# Patient Record
Sex: Female | Born: 1990 | Hispanic: Yes | Marital: Single | Smoking: Never smoker
Health system: Southern US, Community
[De-identification: ages and names within clinical notes are randomized; demographics above are authoritative.]

---
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
## 2010-10-07 ENCOUNTER — Ambulatory Visit: Payer: Self-pay | Admitting: Family

## 2010-10-30 ENCOUNTER — Observation Stay: Payer: Self-pay | Admitting: Obstetrics and Gynecology

## 2010-11-10 ENCOUNTER — Ambulatory Visit: Payer: Self-pay | Admitting: Family

## 2012-03-06 IMAGING — US US OB US >=[ID] SNGL FETUS
1 series · 13 of 28 positions shown · non-contrast
Comparison: none

REASON FOR EXAM: dates placenta location anatomy
COMMENTS:

[Series 1: us ob us >=(id) sngl fetus · 0.33mm/px · 13 of 88 slices shown]
[im 4/88]
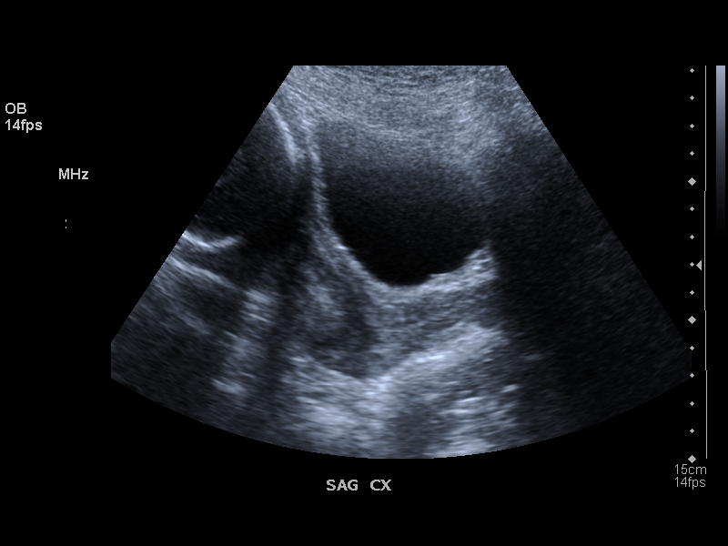
[im 10/88]
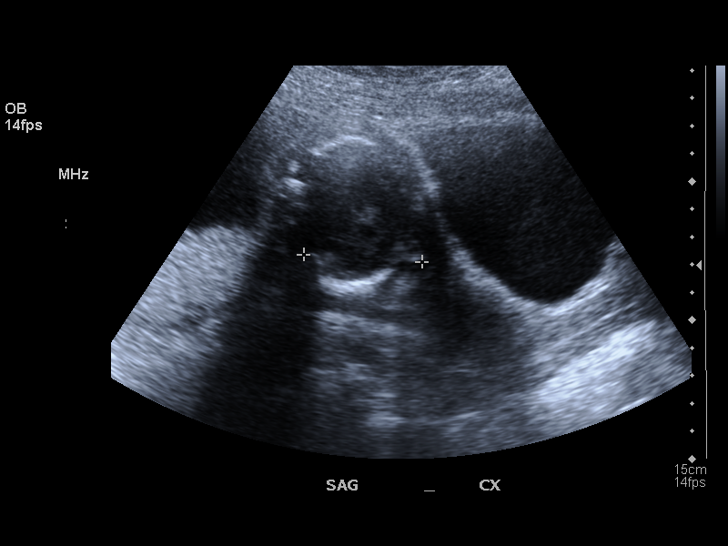
[im 17/88]
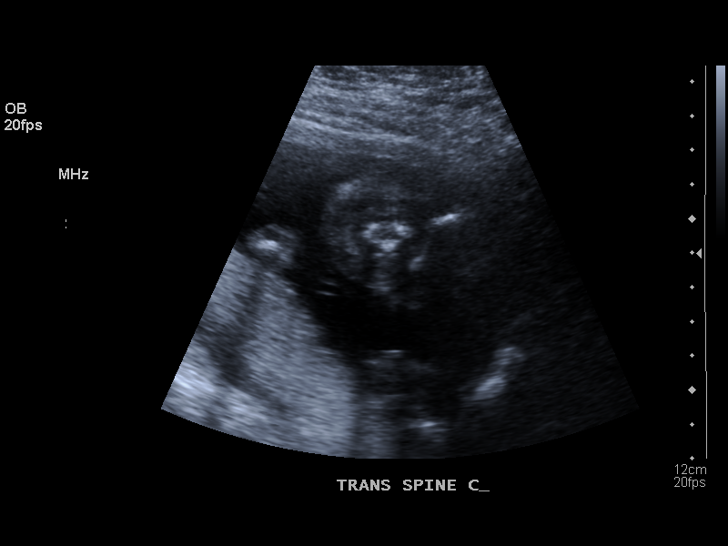
[im 23/88]
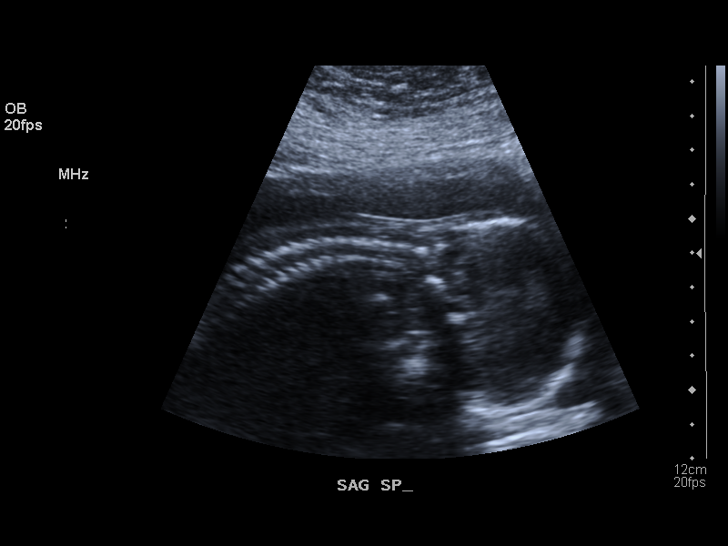
[im 30/88]
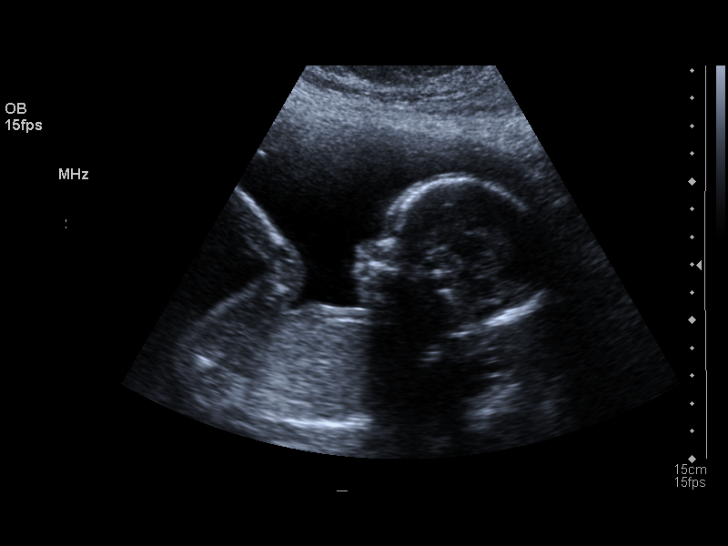
[im 36/88]
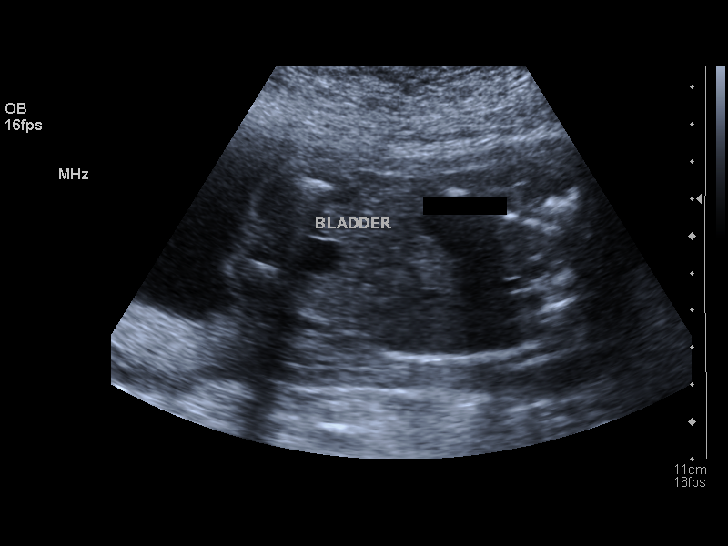
[im 46/88]
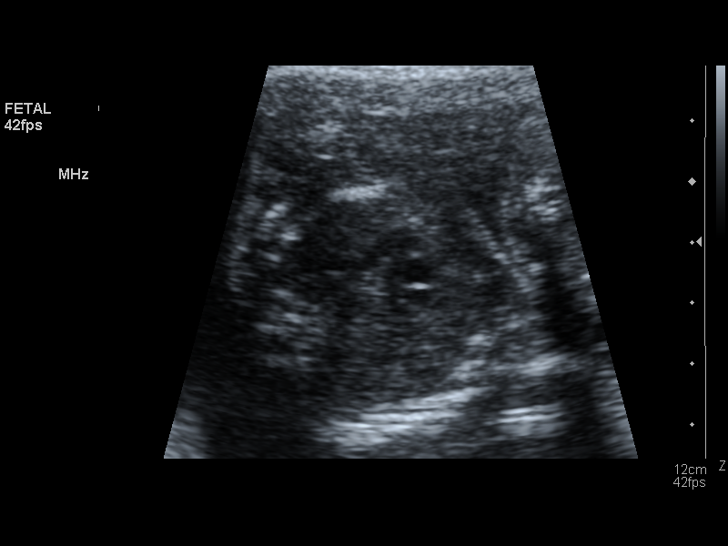
[im 52/88]
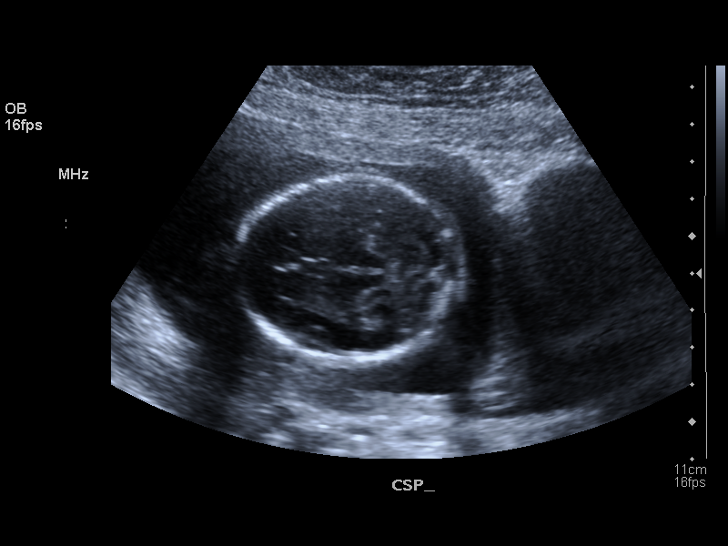
[im 59/88]
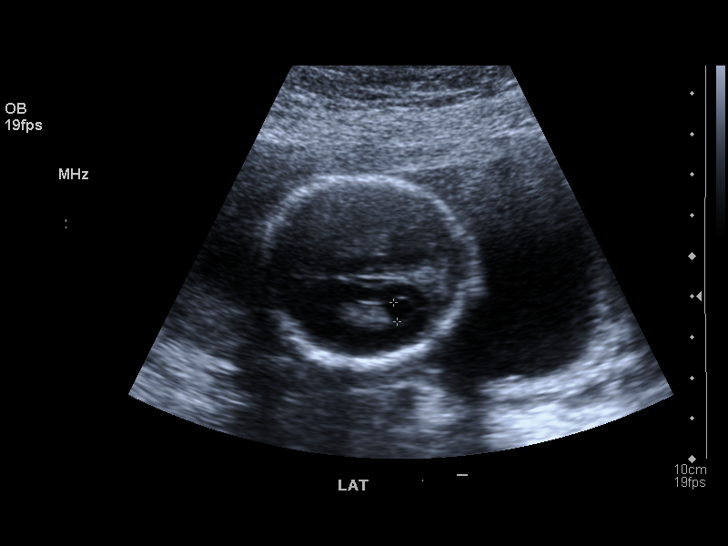
[im 65/88]
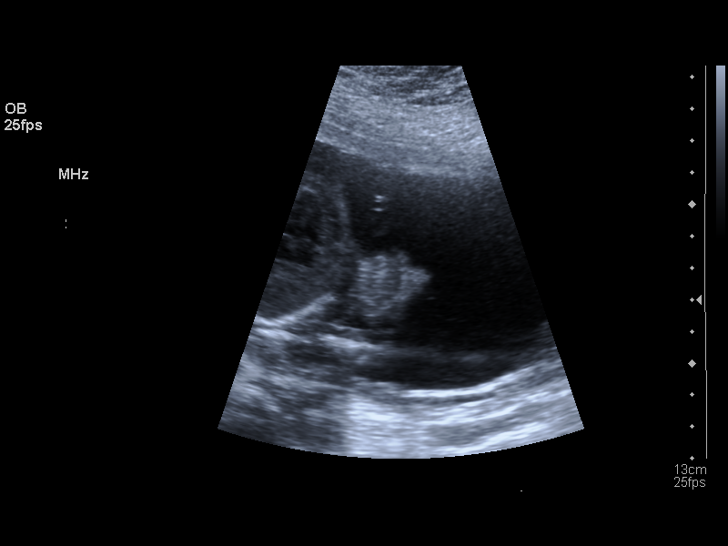
[im 71/88]
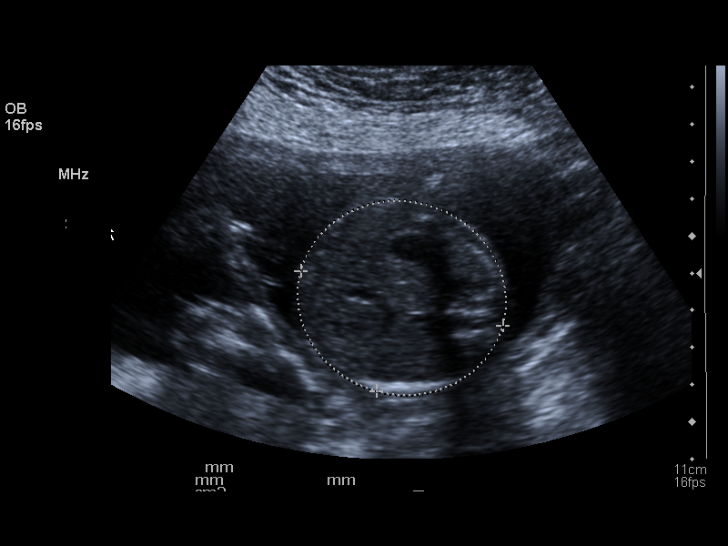
[im 78/88]
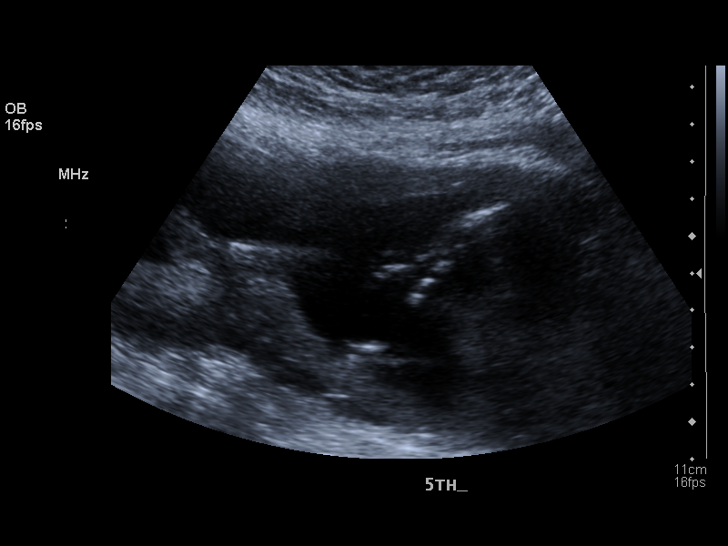
[im 84/88]
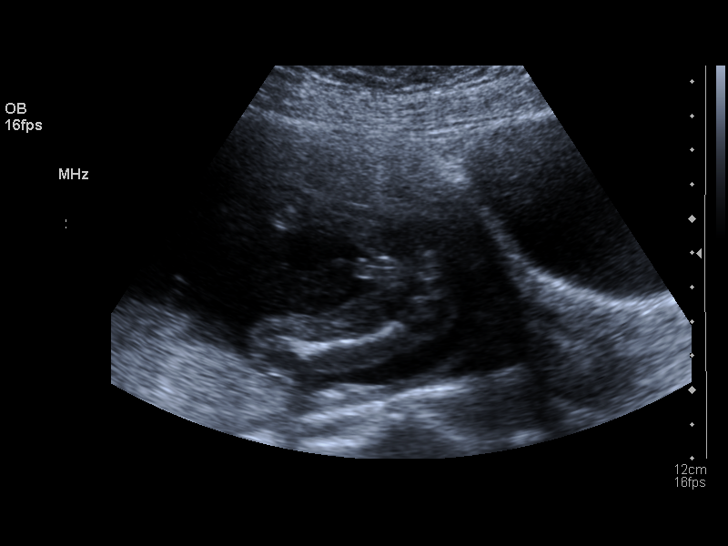

[13 of 28 positions shown; findings below may reference images not displayed]

PROCEDURE:     US  - US OB GREATER/OR EQUAL TO VVF4G  - October 07, 2010  [DATE]

RESULT:     There is a gravid uterus present. The presentation is cephalic.
The amniotic fluid volume is estimated to be normal. The placenta is
posterior and partially fundal. The lower placental segment measures 4.3 cm
from the internal cervical os.

Fetal cardiac activity of 108 bpm and was demonstrated. A 4 chambered heart
was seen. The intracranial structures, the craniocervical junction, and the
spinal structures are grossly normal. The fetal stomach, kidneys, and
urinary bladder were demonstrated.

Measured parameters:
BPD 49 mm corresponding to an EGA of 20 weeks 6 days
HC 181.2 mm corresponding to an EGA of 20 weeks 4 days
HC 168.5 mm corresponding to an EGA of 21 weeks 6 days
FL 34.6 mm corresponding to an EGA of 20 weeks 6 days
HL 34.2 mm corresponding to an EGA of 21 weeks 5 days
Estimated fetal weight is 414 grams + / - 61 grams.
IMPRESSION: There is a viable IUP with estimated gestational age of 21
weeks one day + / - 12 days. The estimated date of confinement is 16 February, 2011. No fetal anomalies were identified.

## 2016-01-27 ENCOUNTER — Emergency Department
Admission: EM | Admit: 2016-01-27 | Discharge: 2016-01-27 | Disposition: A | Discharge status: Home / Self Care | Payer: Medicaid Other | Attending: Emergency Medicine | Admitting: Emergency Medicine

## 2016-01-27 ENCOUNTER — Emergency Department: Payer: Medicaid Other

## 2016-01-27 ENCOUNTER — Encounter: Payer: Self-pay | Admitting: Emergency Medicine

## 2016-01-27 DIAGNOSIS — R1031 Right lower quadrant pain: Secondary | ICD-10-CM | POA: Diagnosis not present

## 2016-01-27 DIAGNOSIS — R102 Pelvic and perineal pain: Secondary | ICD-10-CM | POA: Diagnosis not present

## 2016-01-27 DIAGNOSIS — R1032 Left lower quadrant pain: Secondary | ICD-10-CM | POA: Insufficient documentation

## 2016-01-27 DIAGNOSIS — O2691 Pregnancy related conditions, unspecified, first trimester: Secondary | ICD-10-CM

## 2016-01-27 DIAGNOSIS — Z3A09 9 weeks gestation of pregnancy: Secondary | ICD-10-CM | POA: Insufficient documentation

## 2016-01-27 DIAGNOSIS — O26891 Other specified pregnancy related conditions, first trimester: Secondary | ICD-10-CM | POA: Insufficient documentation

## 2016-01-27 LAB — COMPREHENSIVE METABOLIC PANEL
ALBUMIN: 4.3 g/dL (ref 3.5–5.0)
ALK PHOS: 85 U/L (ref 38–126)
ALT: 74 U/L — ABNORMAL HIGH (ref 14–54)
ANION GAP: 7 (ref 5–15)
AST: 46 U/L — ABNORMAL HIGH (ref 15–41)
BILIRUBIN TOTAL: 0.7 mg/dL (ref 0.3–1.2)
BUN: 13 mg/dL (ref 6–20)
CALCIUM: 9.3 mg/dL (ref 8.9–10.3)
CO2: 22 mmol/L (ref 22–32)
Chloride: 107 mmol/L (ref 101–111)
Creatinine, Ser: 0.74 mg/dL (ref 0.44–1.00)
GFR calc Af Amer: >60 mL/min (ref >60)
GFR calc non Af Amer: >60 mL/min (ref >60)
GLUCOSE: 113 mg/dL — AB (ref 65–99)
Potassium: 3.5 mmol/L (ref 3.5–5.1)
Sodium: 136 mmol/L (ref 135–145)
TOTAL PROTEIN: 7.6 g/dL (ref 6.5–8.1)

## 2016-01-27 LAB — CBC WITH DIFFERENTIAL/PLATELET
BASOS ABS: 0 10*3/uL (ref 0–0.1)
Basophils Relative: 1 %
EOS PCT: 1 %
Eosinophils Absolute: 0.1 10*3/uL (ref 0–0.7)
HEMATOCRIT: 38.6 % (ref 35.0–47.0)
Hemoglobin: 13.6 g/dL (ref 12.0–16.0)
LYMPHS PCT: 22 %
Lymphs Abs: 2.1 10*3/uL (ref 1.0–3.6)
MCH: 30.8 pg (ref 26.0–34.0)
MCHC: 35.2 g/dL (ref 32.0–36.0)
MCV: 87.5 fL (ref 80.0–100.0)
MONO ABS: 0.6 10*3/uL (ref 0.2–0.9)
MONOS PCT: 6 %
NEUTROS ABS: 6.6 10*3/uL — AB (ref 1.4–6.5)
Neutrophils Relative %: 70 %
PLATELETS: 235 10*3/uL (ref 150–440)
RBC: 4.41 MIL/uL (ref 3.80–5.20)
RDW: 12.7 % (ref 11.5–14.5)
WBC: 9.4 10*3/uL (ref 3.6–11.0)

## 2016-01-27 LAB — URINALYSIS COMPLETE WITH MICROSCOPIC (ARMC ONLY)
BACTERIA UA: NONE SEEN
Bilirubin Urine: NEGATIVE
GLUCOSE, UA: 50 mg/dL — AB
Ketones, ur: NEGATIVE mg/dL
Leukocytes, UA: NEGATIVE
Nitrite: NEGATIVE
Protein, ur: 100 mg/dL — AB
Specific Gravity, Urine: 1.023 (ref 1.005–1.030)
Squamous Epithelial / LPF: NONE SEEN
pH: 6 (ref 5.0–8.0)

## 2016-01-27 LAB — ABO/RH: ABO/RH(D): O POS

## 2016-01-27 LAB — HCG, QUANTITATIVE, PREGNANCY: hCG, Beta Chain, Quant, S: 79490 m[IU]/mL — ABNORMAL HIGH (ref <5)

## 2016-01-27 LAB — POCT PREGNANCY, URINE: PREG TEST UR: POSITIVE — AB

## 2016-01-27 MED ORDER — ACETAMINOPHEN 325 MG PO TABS
650.0000 mg | ORAL_TABLET | Freq: Once | ORAL | Status: AC
  Administered 2016-01-27: 650 mg via ORAL

## 2016-01-27 NOTE — Discharge Instructions (Signed)
Please return immediately if condition worsens. Please contact her primary physician or the physician you were given for referral. If you have any specialist physicians involved in her treatment and plan please also contact them. Thank you for using [facility].  Please take over-the-counter Maalox for indigestion along with Tylenol for pain.

## 2016-01-27 NOTE — ED Provider Notes (Signed)
Time Seen: Approximately 1639 I have reviewed the triage notes  Chief Complaint: Abdominal Pain   History of Present Illness: Doris Calhoun is a 25 y.o. female who is gravida 2 para 1 currently states approximately 3 months pregnant. Patient states that she's been having some lower abdominal pain. She points primarily to lower middle region and slightly worse to the left than the right. She states she got concerned today because she had some spotty vaginal bleeding but apparently the pain is been occurring now for the last week. The bleeding is mild spotty in nature without any fever at home. She also states she's had some nausea and points to the epigastric area as the source of discomfort. Eyes any persistent vomiting. She denies any back or flank pain. She denies any dysuria, hematuria or urinary frequency   History reviewed. No pertinent past medical history.  There are no active problems to display for this patient.   History reviewed. No pertinent surgical history.  History reviewed. No pertinent surgical history.    Allergies:  Advil [ibuprofen]  Family History: No family history on file.  Social History: Social History  Substance Use Topics  . Smoking status: Never Smoker  . Smokeless tobacco: Never Used  . Alcohol use Not on file     Review of Systems:   10 point review of systems was performed and was otherwise negative:  Constitutional: No fever Eyes: No visual disturbances ENT: No sore throat, ear pain Cardiac: No chest pain Respiratory: No shortness of breath, wheezing, or stridor Abdomen: Patient points primarily to the upper middle quadrant and also the lower middle quadrant region Endocrine: No weight loss, No night sweats Extremities: No peripheral edema, cyanosis Skin: No rashes, easy bruising Neurologic: No focal weakness, trouble with speech or swollowing  Urologic: No dysuria, Hematuria, or urinary frequency   Physical Exam:  ED  Triage Vitals [01/27/16 1420]  Enc Vitals Group     BP 130/76     Pulse Rate 74     Resp 18     Temp 98.6 F (37 C)     Temp Source Oral     SpO2 99 %     Weight 228 lb (103.4 kg)     Height 5\' 3"  (1.6 m)     Head Circumference      Peak Flow      Pain Score 9     Pain Loc      Pain Edu?      Excl. in GC?     General: Awake , Alert , and Oriented times 3; GCS 15 Head: Normal cephalic , atraumatic Eyes: Pupils equal , round, reactive to light Nose/Throat: No nasal drainage, patent upper airway without erythema or exudate.  Neck: Supple, Full range of motion, No anterior adenopathy or palpable thyroid masses Lungs: Clear to ascultation without wheezes , rhonchi, or rales Heart: Regular rate, regular rhythm without murmurs , gallops , or rubs Abdomen: Soft, non tender without rebound, guarding , or rigidity; bowel sounds positive and symmetric in all 4 quadrants. No organomegaly .        Extremities: 2 plus symmetric pulses. No edema, clubbing or cyanosis Neurologic: normal ambulation, Motor symmetric without deficits, sensory intact Skin: warm, dry, no rashes   Labs:   All laboratory work was reviewed including any pertinent negatives or positives listed below:  Labs Reviewed  CBC WITH DIFFERENTIAL/PLATELET - Abnormal; Notable for the following:       Result Value  Neutro Abs 6.6 (*)    All other components within normal limits  COMPREHENSIVE METABOLIC PANEL - Abnormal; Notable for the following:    Glucose, Bld 113 (*)    AST 46 (*)    ALT 74 (*)    All other components within normal limits  HCG, QUANTITATIVE, PREGNANCY - Abnormal; Notable for the following:    hCG, Beta Chain, Quant, S 79,490 (*)    All other components within normal limits  URINALYSIS COMPLETEWITH MICROSCOPIC ([facility] ONLY) - Abnormal; Notable for the following:    Color, Urine RED (*)    APPearance CLOUDY (*)    Glucose, UA 50 (*)    Hgb urine dipstick 3+ (*)    Protein, ur 100 (*)    All other  components within normal limits  POCT PREGNANCY, URINE - Abnormal; Notable for the following:    Preg Test, Ur POSITIVE (*)    All other components within normal limits  POC URINE PREG, ED  ABO/RH  Blood type is positive. Urine does not look like a good sample with a lot of blood and white blood cells in it.  Radiology: * "[place] Ob Comp Less 14 Wks  Result Date: 01/27/2016 CLINICAL DATA:  Left-sided pelvic pain for 3 days. First-trimester pregnancy. EXAM: OBSTETRIC <14 WK US AND TRANSVAGINAL OB US TECHNIQUE: Both transabdominal and transvaginal ultrasound examinations were performed for complete evaluation of the gestation as well as the maternal uterus, adnexal regions, and pelvic cul-de-sac. Transvaginal technique was performed to assess early pregnancy. COMPARISON:  None. FINDINGS: Intrauterine gestational sac: Single. Yolk sac:  Present Embryo:  Present Cardiac Activity: Present Heart Rate: 173  bpm CRL:  27.2  mm   9 w   4 d                  US EDC: 08/27/2016 Subchorionic hemorrhage:  None visualized. Maternal uterus/adnexae: Uterus and right adnexa are within normal limits. The left ovary is not discretely visualized. IMPRESSION: Normal sonographic appearance of first-trimester pregnancy with an estimated gestational age of [redacted] weeks and 4 days. No acute or focal lesion to explain the patient's left lower quadrant pain. Electronically Signed   By: Marin Robertshristopher  Mattern M.D.   On: 01/27/2016 16:47   [place] Ob Transvaginal  Result Date: 01/27/2016 CLINICAL DATA:  Left-sided pelvic pain for 3 days. First-trimester pregnancy. EXAM: OBSTETRIC <14 WK US AND TRANSVAGINAL OB US TECHNIQUE: Both transabdominal and transvaginal ultrasound examinations were performed for complete evaluation of the gestation as well as the maternal uterus, adnexal regions, and pelvic cul-de-sac. Transvaginal technique was performed to assess early pregnancy. COMPARISON:  None. FINDINGS: Intrauterine gestational sac: Single. Yolk sac:   Present Embryo:  Present Cardiac Activity: Present Heart Rate: 173  bpm CRL:  27.2  mm   9 w   4 d                  US EDC: 08/27/2016 Subchorionic hemorrhage:  None visualized. Maternal uterus/adnexae: Uterus and right adnexa are within normal limits. The left ovary is not discretely visualized. IMPRESSION: Normal sonographic appearance of first-trimester pregnancy with an estimated gestational age of [redacted] weeks and 4 days. No acute or focal lesion to explain the patient's left lower quadrant pain. Electronically Signed   By: Marin Robertshristopher  Mattern M.D.   On: 01/27/2016 16:47  "  I personally reviewed the radiologic studies    ED Course: Patient's differential included causes for her to be pain including ectopic pregnancy, threatened spontaneous  abortion, normal pain from first trimester pregnancy, etc. Epigastric pain differential included acute cholecystitis or gastritis and reflux-type symptoms associated with early pregnancy nausea.  Patient appears to be stable from a OB/GYN standpoint she describes a pain score of 9 yet his lying comfortably in the stretcher. She was given Tylenol for pain. She's been advised continue with her follow-up with her OB/GYN. Clinical Course     Assessment: First trimester lower abdominal pain      Plan:  Patient was advised to use over-the-counter Maalox and take Tylenol for pain. Patient was advised to return immediately if condition worsens. Patient was advised to follow up with their primary care physician or other specialized physicians involved in their outpatient care. The patient and/or family member/power of attorney had laboratory results reviewed at the bedside. All questions and concerns were addressed and appropriate discharge instructions were distributed by the nursing staff. Jennye Moccasin*            Kiing Deakin S Deandrew Hoecker, MD 01/27/16 [phone]

## 2016-01-27 NOTE — ED Triage Notes (Signed)
Reports 3 months preg, having pain in right side.  Denies vag bleeding

## 2016-10-07 IMAGING — US US OB COMP LESS 14 WK
1 series · 14 of 28 positions shown · non-contrast
Comparison: None.

CLINICAL DATA: Left-sided pelvic pain for 3 days. First-trimester
pregnancy.

EXAM:
OBSTETRIC <14 WK US AND TRANSVAGINAL OB US
TECHNIQUE: Both transabdominal and transvaginal ultrasound examinations were
performed for complete evaluation of the gestation as well as the
maternal uterus, adnexal regions, and pelvic cul-de-sac.
Transvaginal technique was performed to assess early pregnancy.

[Series 1: us ob comp less 14 wk · 0.17mm/px · 14 of 106 slices shown]
[im 4/106]
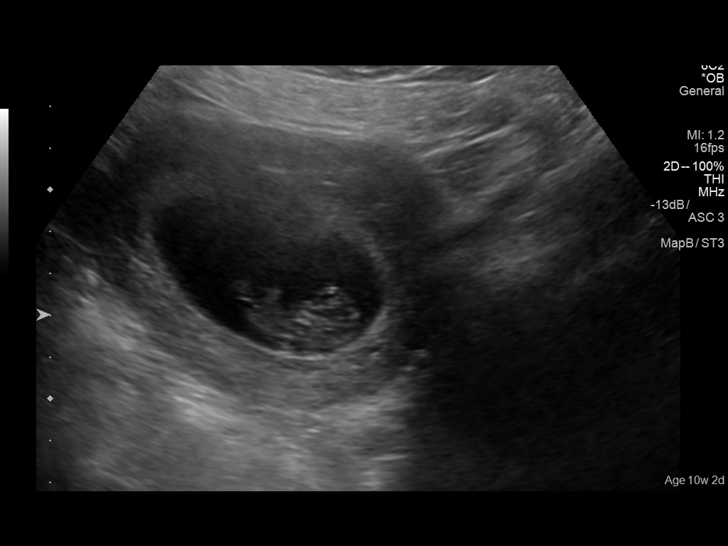
[im 12/106]
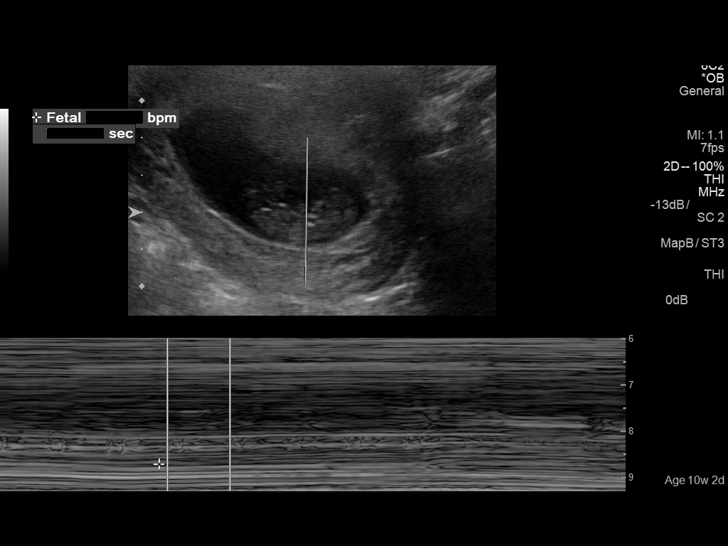
[im 20/106]
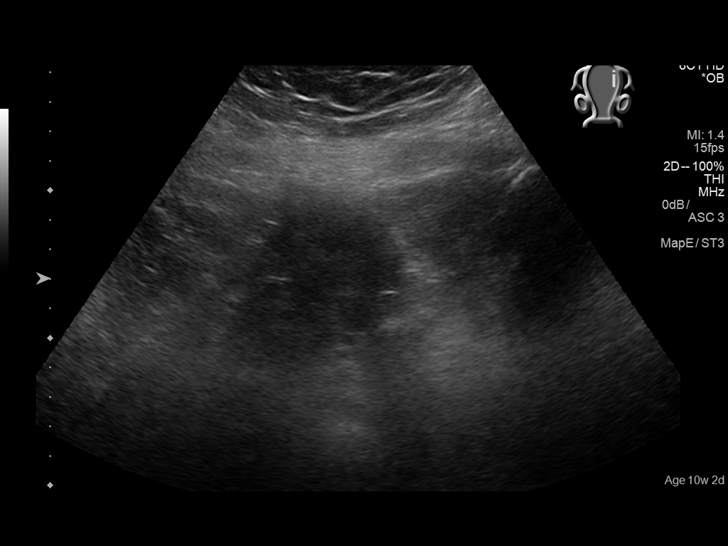
[im 28/106]
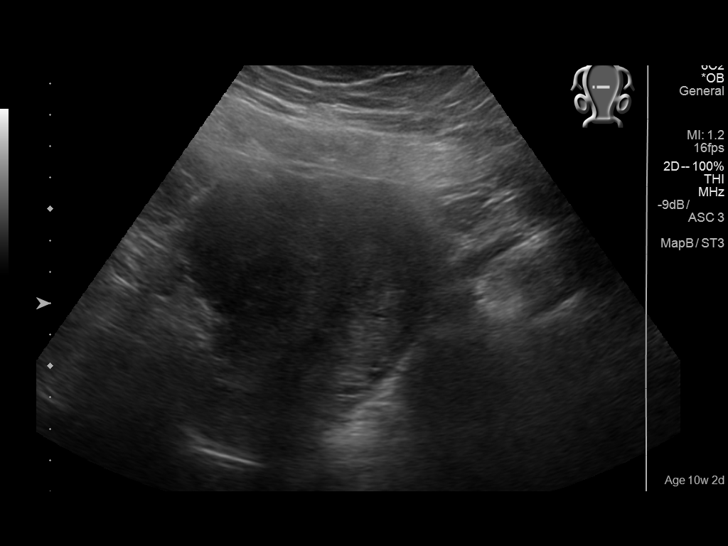
[im 36/106]
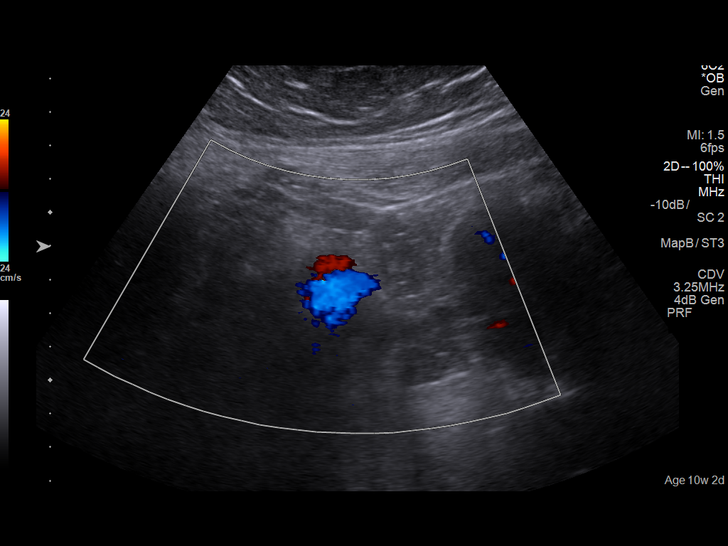
[im 43/106]
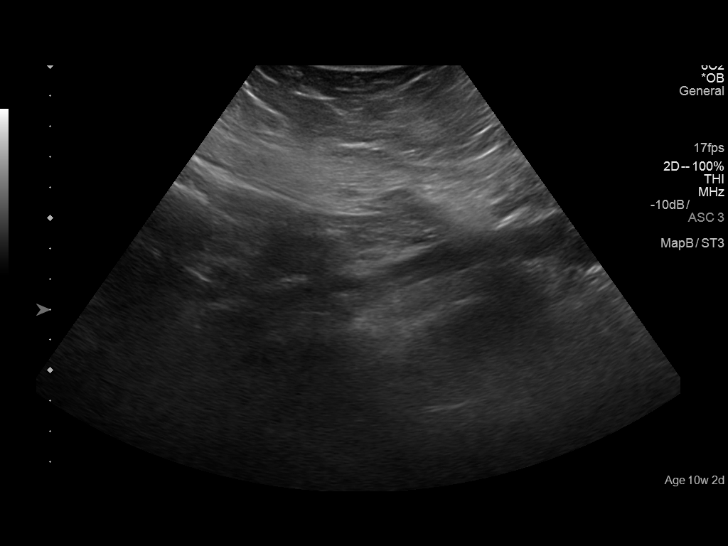
[im 51/106]
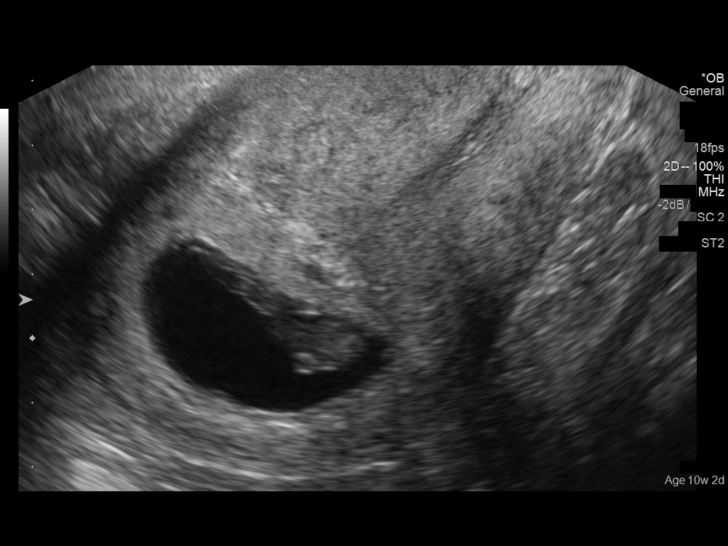
[im 59/106]
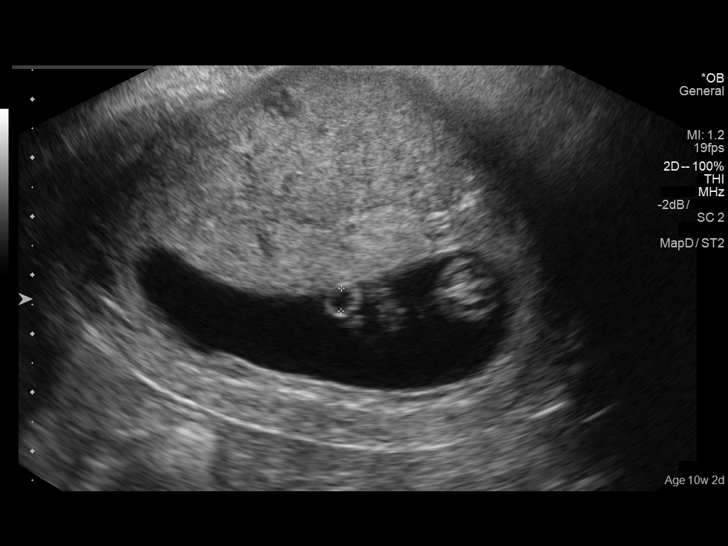
[im 67/106]
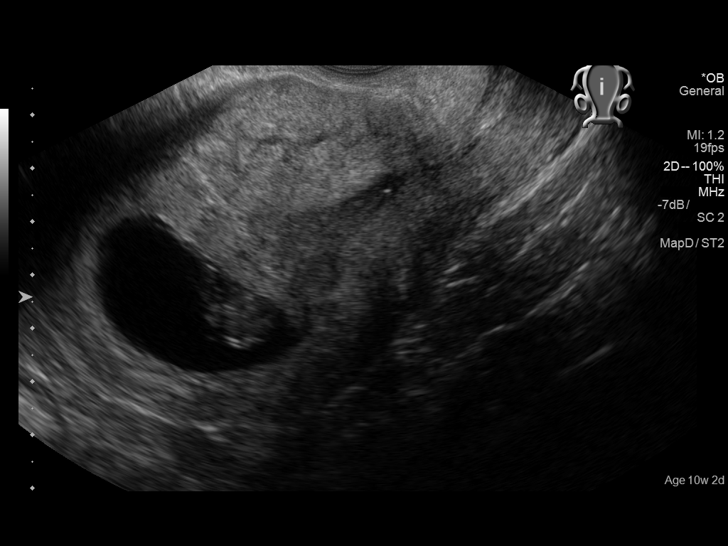
[im 74/106]
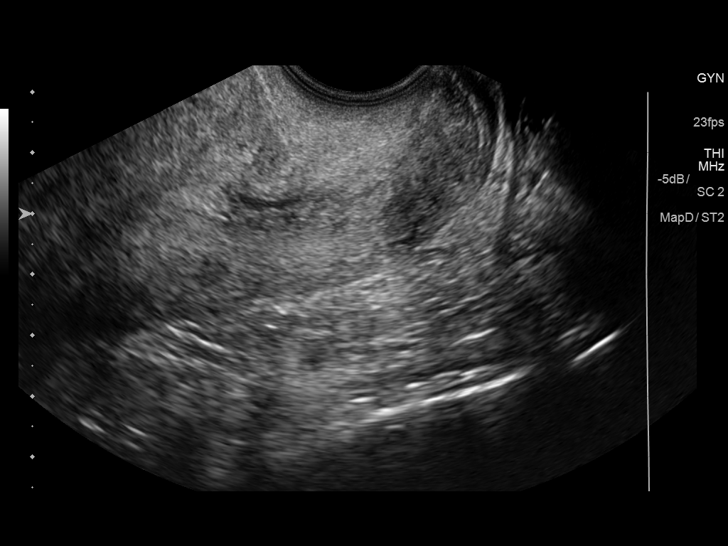
[im 82/106]
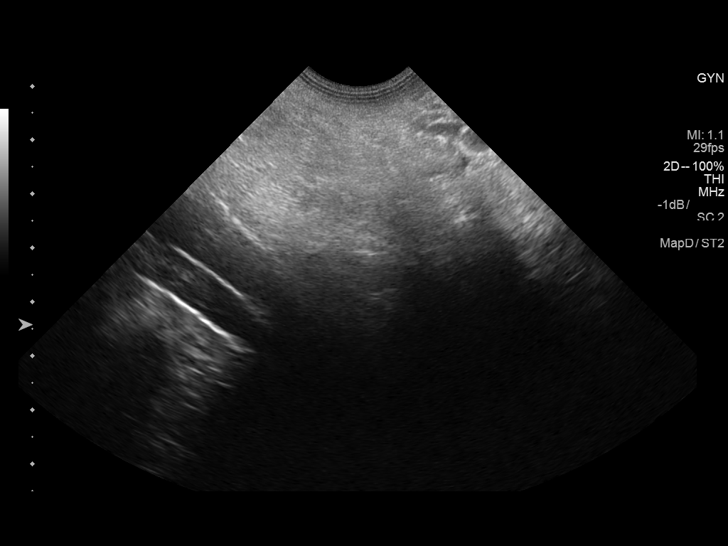
[im 90/106]
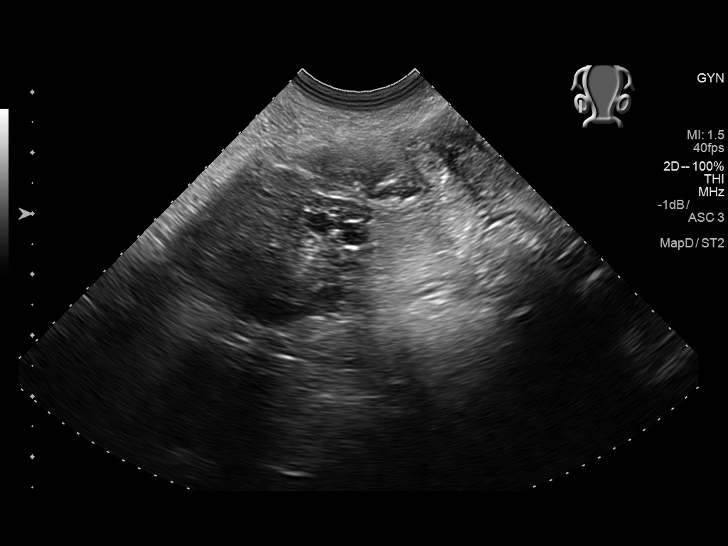
[im 98/106]
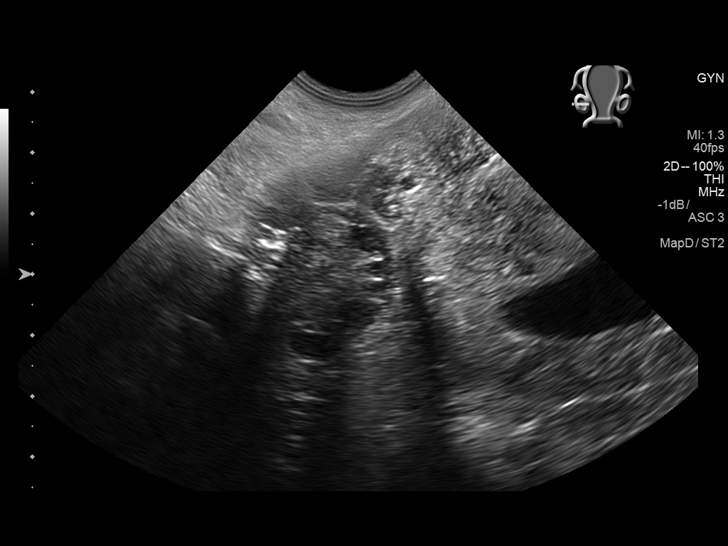
[im 106/106]
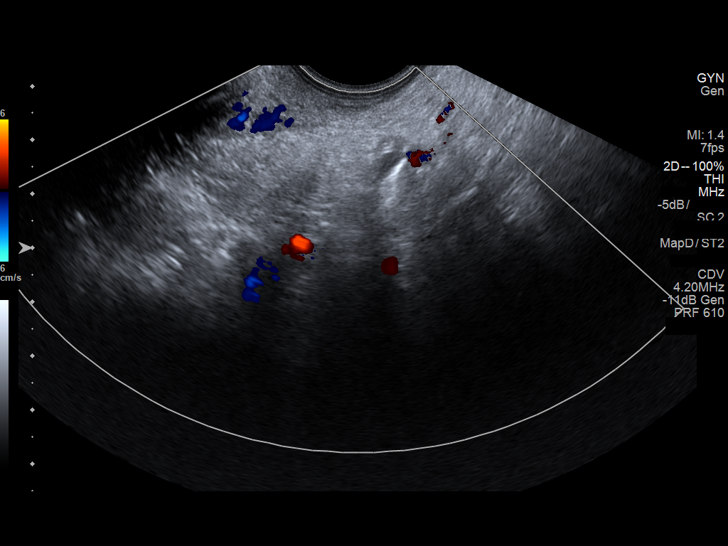

[14 of 28 positions shown; findings below may reference images not displayed]

FINDINGS: Intrauterine gestational sac: Single.

Yolk sac:  Present

Embryo:  Present

Cardiac Activity: Present

Heart Rate: 173  bpm

CRL:  27.2  mm   9 w   4 d                  US EDC: 08/27/2016

Subchorionic hemorrhage:  None visualized.

Maternal uterus/adnexae: Uterus and right adnexa are within normal
limits. The left ovary is not discretely visualized.
IMPRESSION: Normal sonographic appearance of first-trimester pregnancy with an
estimated gestational age of 9 weeks and 4 days.

No acute or focal lesion to explain the patient's left lower
quadrant pain.

## 2018-01-19 ENCOUNTER — Ambulatory Visit: Payer: Self-pay | Admitting: Ophthalmology

## 2018-01-21 ENCOUNTER — Other Ambulatory Visit: Payer: Self-pay

## 2018-01-21 ENCOUNTER — Ambulatory Visit
Admission: EM | Admit: 2018-01-21 | Discharge: 2018-01-21 | Disposition: A | Discharge status: Home / Self Care | Payer: Self-pay | Attending: Family Medicine | Admitting: Family Medicine

## 2018-01-21 ENCOUNTER — Encounter: Payer: Self-pay | Admitting: Emergency Medicine

## 2018-01-21 DIAGNOSIS — N946 Dysmenorrhea, unspecified: Secondary | ICD-10-CM

## 2018-01-21 LAB — URINALYSIS, COMPLETE (UACMP) WITH MICROSCOPIC
Bilirubin Urine: NEGATIVE
Glucose, UA: NEGATIVE mg/dL
KETONES UR: NEGATIVE mg/dL
Leukocytes, UA: NEGATIVE
Nitrite: NEGATIVE
PH: 5 (ref 5.0–8.0)
Specific Gravity, Urine: >1.03 — ABNORMAL HIGH (ref 1.005–1.030)

## 2018-01-21 MED ORDER — MEFENAMIC ACID 250 MG PO CAPS: ORAL_CAPSULE | ORAL | 0 refills | Status: AC

## 2018-01-21 MED ORDER — DICLOFENAC SODIUM 50 MG PO TBEC: 50.0000 mg | DELAYED_RELEASE_TABLET | Freq: Three times a day (TID) | ORAL | 0 refills | Status: AC | PRN

## 2018-01-21 NOTE — ED Triage Notes (Signed)
Patient c/o lower abdominal pain and cramps that radiates to her rectum that started yesterday.  Patient states that she started her menstrual cycle on Monday.

## 2018-01-21 NOTE — Discharge Instructions (Signed)
Medication as prescribed. ° °See GYN. ° °Take care ° °Dr. Selby Slovacek  °

## 2018-01-21 NOTE — ED Provider Notes (Signed)
[facility]    CSN: [id number] Arrival date & time: 01/21/18  1647  History   Chief Complaint Chief Complaint  Patient presents with  . Abdominal Pain   HPI  27 year old female presents with abdominal pain.  Patient is currently on her menstrual cycle.  She states that she had worsening pain as of yesterday and today.  She reports bilateral lower abdominal pain.  She states it is more severe than her normal menstrual cramping.  She reports heavy cycle.  She also reports rectal pain.  She states that her pain is worse when she sits down.  Patient states that she is been having some frequent urination.  Patient alludes to urinary discomfort but this appears to be just from her abdominal pain itself.  No true dysuria.  No fevers or chills.  She has taken over-the-counter Tylenol without improvement.  Per her report, her pain is severe.  No other associated symptoms.  No other complaints.  PMH: Hx of GDM, Morbid Obesity, Non Alcoholic fatty liver, Asthma  OB History    Gravida  1   Para      Term      Preterm      AB      Living        SAB      TAB      Ectopic      Multiple      Live Births             Home Medications    Prior to Admission medications   Medication Sig Start Date End Date Taking? Authorizing Provider  Mefenamic Acid 250 MG CAPS Initial: 500 mg, then 250 mg every 6 hours as needed. Do not exceed 1 week. 01/21/18   Tommie Sams, DO   Social History Social History   Tobacco Use  . Smoking status: Never Smoker  . Smokeless tobacco: Never Used  Substance Use Topics  . Alcohol use: Yes  . Drug use: Never   Allergies   Advil [ibuprofen]   Review of Systems Review of Systems  Constitutional: Negative.   Gastrointestinal: Positive for abdominal pain.       Rectal pain.  Genitourinary: Positive for frequency and pelvic pain.   Physical Exam Triage Vital Signs ED Triage Vitals  Enc Vitals Group     BP 01/21/18 1717 121/86    Pulse Rate 01/21/18 1717 85     Resp 01/21/18 1717 16     Temp 01/21/18 1717 98.2 F (36.8 C)     Temp Source 01/21/18 1717 Oral     SpO2 01/21/18 1717 99 %     Weight 01/21/18 1713 237 lb 12.8 oz (107.9 kg)     Height 01/21/18 1713 5\' 3"  (1.6 m)     Head Circumference --      Peak Flow --      Pain Score 01/21/18 1713 10     Pain Loc --      Pain Edu? --      Excl. in GC? --    Updated Vital Signs BP 121/86 (BP Location: Left Arm)   Pulse 85   Temp 98.2 F (36.8 C) (Oral)   Resp 16   Ht 5\' 3"  (1.6 m)   Wt 107.9 kg   LMP 01/17/2018 (Exact Date)   SpO2 99%   Breastfeeding? Yes   BMI 42.12 kg/m   Visual Acuity Right Eye Distance:   Left Eye Distance:   Bilateral Distance:  Right Eye Near:   Left Eye Near:    Bilateral Near:     Physical Exam  Constitutional: She is oriented to person, place, and time. She appears well-developed. No distress.  Cardiovascular: Normal rate and regular rhythm.  Pulmonary/Chest: Effort normal and breath sounds normal. She has no wheezes. She has no rales.  Abdominal: Soft.  Patient endorsing pain anywhere the abdomen is palpated.  Most notable in the lower abdomen throughout.  Neurological: She is alert and oriented to person, place, and time.  Psychiatric: Her behavior is normal.  Flat affect.  Nursing note and vitals reviewed.  UC Treatments / Results  Labs (all labs ordered are listed, but only abnormal results are displayed) Labs Reviewed  URINALYSIS, COMPLETE (UACMP) WITH MICROSCOPIC - Abnormal; Notable for the following components:      Result Value   APPearance HAZY (*)    Specific Gravity, Urine >1.030 (*)    Hgb urine dipstick TRACE (*)    Protein, ur TRACE (*)    Bacteria, UA FEW (*)    All other components within normal limits    EKG None  Radiology No results found.  Procedures Procedures (including critical care time)  Medications Ordered in UC Medications - No data to display  Initial Impression /  Assessment and Plan / UC Course  I have reviewed the triage vital signs and the nursing notes.  Pertinent labs & imaging results that were available during my care of the patient were reviewed by me and considered in my medical decision making (see chart for details).    27 year old female presents with lower abdominal pain, back pain, rectal pain.  This appears to be secondary to dysmenorrhea.  Patient is allergic to ibuprofen which causes a rash.  Trial of Mefenamic acid.  Per the medical literature, this is okay during breast-feeding.  Final Clinical Impressions(s) / UC Diagnoses   Final diagnoses:  Dysmenorrhea     Discharge Instructions     Medication as prescribed.  See GYN.  Take care  Dr. Adriana Simasook     ED Prescriptions    Medication Sig Dispense Auth. Provider   Mefenamic Acid 250 MG CAPS Initial: 500 mg, then 250 mg every 6 hours as needed. Do not exceed 1 week. 28 each Tommie Samsook, Tonimarie Gritz G, DO     Controlled Substance Prescriptions [place] Controlled Substance Registry consulted? Not Applicable   Tommie SamsCook, Emmett Arntz G, DO 01/21/18 1805

## 2019-08-31 ENCOUNTER — Telehealth: Payer: Self-pay

## 2019-08-31 NOTE — Telephone Encounter (Signed)
Spoke to Pt about updating eligibility so she can be seen at 12:25 on 08/31/19. Mailed necessary forms.

## 2023-08-20 ENCOUNTER — Ambulatory Visit (LOCAL_COMMUNITY_HEALTH_CENTER): Payer: Self-pay

## 2023-08-20 DIAGNOSIS — Z111 Encounter for screening for respiratory tuberculosis: Secondary | ICD-10-CM

## 2023-08-23 ENCOUNTER — Other Ambulatory Visit: Payer: Self-pay

## 2023-08-27 ENCOUNTER — Other Ambulatory Visit: Payer: Self-pay

## 2023-08-27 ENCOUNTER — Ambulatory Visit (LOCAL_COMMUNITY_HEALTH_CENTER): Payer: Self-pay

## 2023-08-27 DIAGNOSIS — Z111 Encounter for screening for respiratory tuberculosis: Secondary | ICD-10-CM

## 2023-08-30 ENCOUNTER — Ambulatory Visit (LOCAL_COMMUNITY_HEALTH_CENTER): Payer: Self-pay

## 2023-08-30 DIAGNOSIS — Z111 Encounter for screening for respiratory tuberculosis: Secondary | ICD-10-CM

## 2023-08-30 LAB — TB SKIN TEST
Induration: 0 mm
TB Skin Test: NEGATIVE
